# Patient Record
Sex: Male | Born: 1975 | Race: Black or African American | Hispanic: No | Marital: Married | Smoking: Never smoker
Health system: Southern US, Community
[De-identification: ages and names within clinical notes are randomized; demographics above are authoritative.]

## PROBLEM LIST (undated history)

## (undated) DIAGNOSIS — J45909 Unspecified asthma, uncomplicated: Secondary | ICD-10-CM

## (undated) HISTORY — PX: FINGER SURGERY: SHX640

## (undated) HISTORY — PX: SHOULDER ARTHROSCOPY: SHX128

---

## 2016-02-23 ENCOUNTER — Encounter (HOSPITAL_COMMUNITY): Payer: Self-pay

## 2016-02-23 ENCOUNTER — Emergency Department (HOSPITAL_COMMUNITY)
Admission: EM | Admit: 2016-02-23 | Discharge: 2016-02-23 | Disposition: A | Discharge status: Home / Self Care | Payer: BC Managed Care – PPO | Attending: Emergency Medicine | Admitting: Emergency Medicine

## 2016-02-23 ENCOUNTER — Emergency Department (HOSPITAL_COMMUNITY): Payer: BC Managed Care – PPO

## 2016-02-23 DIAGNOSIS — Z7982 Long term (current) use of aspirin: Secondary | ICD-10-CM | POA: Insufficient documentation

## 2016-02-23 DIAGNOSIS — R0789 Other chest pain: Secondary | ICD-10-CM | POA: Diagnosis not present

## 2016-02-23 DIAGNOSIS — J45909 Unspecified asthma, uncomplicated: Secondary | ICD-10-CM | POA: Insufficient documentation

## 2016-02-23 DIAGNOSIS — R079 Chest pain, unspecified: Secondary | ICD-10-CM

## 2016-02-23 HISTORY — DX: Unspecified asthma, uncomplicated: J45.909

## 2016-02-23 LAB — BASIC METABOLIC PANEL
Anion gap: 6 (ref 5–15)
BUN: 12 mg/dL (ref 6–20)
CO2: 31 mmol/L (ref 22–32)
Calcium: 9.5 mg/dL (ref 8.9–10.3)
Chloride: 101 mmol/L (ref 101–111)
Creatinine, Ser: 1.07 mg/dL (ref 0.61–1.24)
GFR calc Af Amer: >60 mL/min (ref >60)
Glucose, Bld: 102 mg/dL — ABNORMAL HIGH (ref 65–99)
POTASSIUM: 3.7 mmol/L (ref 3.5–5.1)
SODIUM: 138 mmol/L (ref 135–145)

## 2016-02-23 LAB — CBC
HEMATOCRIT: 41.5 % (ref 39.0–52.0)
Hemoglobin: 14.6 g/dL (ref 13.0–17.0)
MCH: 31.5 pg (ref 26.0–34.0)
MCHC: 35.2 g/dL (ref 30.0–36.0)
MCV: 89.6 fL (ref 78.0–100.0)
PLATELETS: 225 10*3/uL (ref 150–400)
RBC: 4.63 MIL/uL (ref 4.22–5.81)
RDW: 13 % (ref 11.5–15.5)
WBC: 7.9 10*3/uL (ref 4.0–10.5)

## 2016-02-23 LAB — TROPONIN I

## 2016-02-23 LAB — I-STAT TROPONIN, ED: TROPONIN I, POC: 0 ng/mL (ref 0.00–0.08)

## 2016-02-23 MED ORDER — IBUPROFEN 800 MG PO TABS: 800.0000 mg | ORAL_TABLET | Freq: Three times a day (TID) | ORAL | 0 refills | Status: AC | PRN

## 2016-02-23 NOTE — Discharge Instructions (Signed)
Return here as needed. Follow up with your PCP.  °

## 2016-02-23 NOTE — ED Triage Notes (Signed)
Onset 1 week upon awakening sharp chest pain on left side, sharp pain that lasted few seconds.  Since then has had "discomfort" in chest a few times most days since onset.  No other s/s noted with chest discomfort.  Was sent to ED by PCP for abnormal EKG.

## 2016-02-24 NOTE — ED Provider Notes (Signed)
[facility]-EMERGENCY DEPT Provider Note   CSN: [id number] Arrival date & time: 02/23/16  1550     History   Chief Complaint Chief Complaint  Patient presents with  . Chest Pain    HPI Evan Harris is a 40 y.o. male.  HPI Patient presents to the emergency department with intermittent chest pain over the last week.  The patient states that he awoke last Monday with a sharp pain that lasts for 2 seconds in his chest.  He states it occurred again on Thursday and then has had intermittent discomfort in the bilateral upper part of his chest randomly throughout the last 4 days.  Patient states he was seen by his primary care doctor today and was sent in due to the fact that he had some mild changes on his EKG patient states that he did not take any medications prior to arrival.  States nothing seems to make the condition better or worse. The patient denies shortness of breath, headache,blurred vision, neck pain, fever, cough, weakness, numbness, dizziness, anorexia, edema, abdominal pain, nausea, vomiting, diarrhea, rash, back pain, dysuria, hematemesis, bloody stool, near syncope, or syncope. Past Medical History:  Diagnosis Date  . Asthma     There are no active problems to display for this patient.   Past Surgical History:  Procedure Laterality Date  . FINGER SURGERY    . SHOULDER ARTHROSCOPY         Home Medications    Prior to Admission medications   Medication Sig Start Date End Date Taking? Authorizing Provider  aspirin 325 MG tablet Take 325 mg by mouth every 6 (six) hours as needed for moderate pain.   Yes Historical Provider, MD  guaifenesin (ROBITUSSIN) 100 MG/5ML syrup Take 200 mg by mouth 3 (three) times daily as needed for cough.   Yes Historical Provider, MD  ibuprofen (ADVIL,MOTRIN) 800 MG tablet Take 1 tablet (800 mg total) by mouth every 8 (eight) hours as needed. 02/23/16   Charlestine Nighthristopher Florence Yeung, PA-C    Family History History reviewed. No pertinent family  history.  Social History Social History  Substance Use Topics  . Smoking status: Never Smoker  . Smokeless tobacco: Never Used  . Alcohol use 1.8 oz/week    3 Glasses of wine per week     Allergies   Sulfa antibiotics   Review of Systems Review of Systems All other systems negative except as documented in the HPI. All pertinent positives and negatives as reviewed in the HPI.  Physical Exam Updated Vital Signs BP 101/81   Pulse 61   Temp 98.4 F (36.9 C) (Oral)   Resp 12   Ht 5\' 8"  (1.727 m)   Wt 84.4 kg Comment: was weighed at PCP office today  SpO2 98%   BMI 28.28 kg/m   Physical Exam  Constitutional: He is oriented to person, place, and time. He appears well-developed and well-nourished. No distress.  HENT:  Head: Normocephalic and atraumatic.  Mouth/Throat: Oropharynx is clear and moist.  Eyes: Pupils are equal, round, and reactive to light.  Neck: Normal range of motion. Neck supple.  Cardiovascular: Normal rate, regular rhythm and normal heart sounds.  Exam reveals no gallop and no friction rub.   No murmur heard. Pulmonary/Chest: Effort normal and breath sounds normal. No respiratory distress. He has no wheezes.  Abdominal: Soft. Bowel sounds are normal. He exhibits no distension. There is no tenderness.  Neurological: He is alert and oriented to person, place, and time. He exhibits normal muscle  tone. Coordination normal.  Skin: Skin is warm and dry. No rash noted. No erythema.  Psychiatric: He has a normal mood and affect. His behavior is normal.  Nursing note and vitals reviewed.    ED Treatments / Results  Labs (all labs ordered are listed, but only abnormal results are displayed) Labs Reviewed  BASIC METABOLIC PANEL - Abnormal; Notable for the following:       Result Value   Glucose, Bld 102 (*)    All other components within normal limits  CBC  TROPONIN I  I-STAT TROPOININ, ED    EKG  EKG Interpretation  Date/Time:  Tuesday February 23 2016 16:03:55 EST Ventricular Rate:  65 PR Interval:  156 QRS Duration: 86 QT Interval:  376 QTC Calculation: 391 R Axis:   4 Text Interpretation:  Normal sinus rhythm ST elevation, consider early repolarization Borderline ECG agree. no STEMI Confirmed by Donnald GarrePfeiffer, MD, Lebron ConnersMarcy [phone]([id number]) on 02/23/2016 9:41:23 PM       Radiology Dg Chest 2 View  Result Date: 02/23/2016 CLINICAL DATA:  Chest pain EXAM: CHEST  2 VIEW COMPARISON:  None. FINDINGS: The heart size and mediastinal contours are within normal limits. Both lungs are clear. The visualized skeletal structures are unremarkable. IMPRESSION: No active cardiopulmonary disease. Electronically Signed   By: Marlan Palauharles  Clark M.D.   On: 02/23/2016 16:49    Procedures Procedures (including critical care time)  Medications Ordered in ED Medications - No data to display   Initial Impression / Assessment and Plan / ED Course  I have reviewed the triage vital signs and the nursing notes.  Pertinent labs & imaging results that were available during my care of the patient were reviewed by me and considered in my medical decision making (see chart for details).  Clinical Course     I have reviewed the patient's laboratory testing and EKG the patient is given the results.  The patient is advised that his symptoms are atypical for cardiac chest pain based on the fact that his pain only lasts for seconds at a time.  I advised him this could be several things, but most likely is a chest wall irritation.  I advised the patient to return here as needed.  Also advised follow-up with his primary care doctor for further evaluation and possible further testing.  The patient agrees the plan and all questions were answered  Final Clinical Impressions(s) / ED Diagnoses   Final diagnoses:  Chest pain, unspecified type  Chest wall pain  Atypical chest pain    New Prescriptions Discharge Medication List as of 02/23/2016 10:12 PM    START taking these  medications   Details  ibuprofen (ADVIL,MOTRIN) 800 MG tablet Take 1 tablet (800 mg total) by mouth every 8 (eight) hours as needed., Starting Tue 02/23/2016, Print         Eli Lilly and CompanyChristopher Mary-Anne Polizzi, PA-C 02/24/16 [id number]    Arby BarretteMarcy Pfeiffer, MD 03/04/16 2303

## 2017-06-02 IMAGING — DX DG CHEST 2V
2 series · 2 of 2 positions shown · non-contrast
Comparison: None.

CLINICAL DATA: Chest pain

EXAM:
CHEST  2 VIEW

[chest pa]
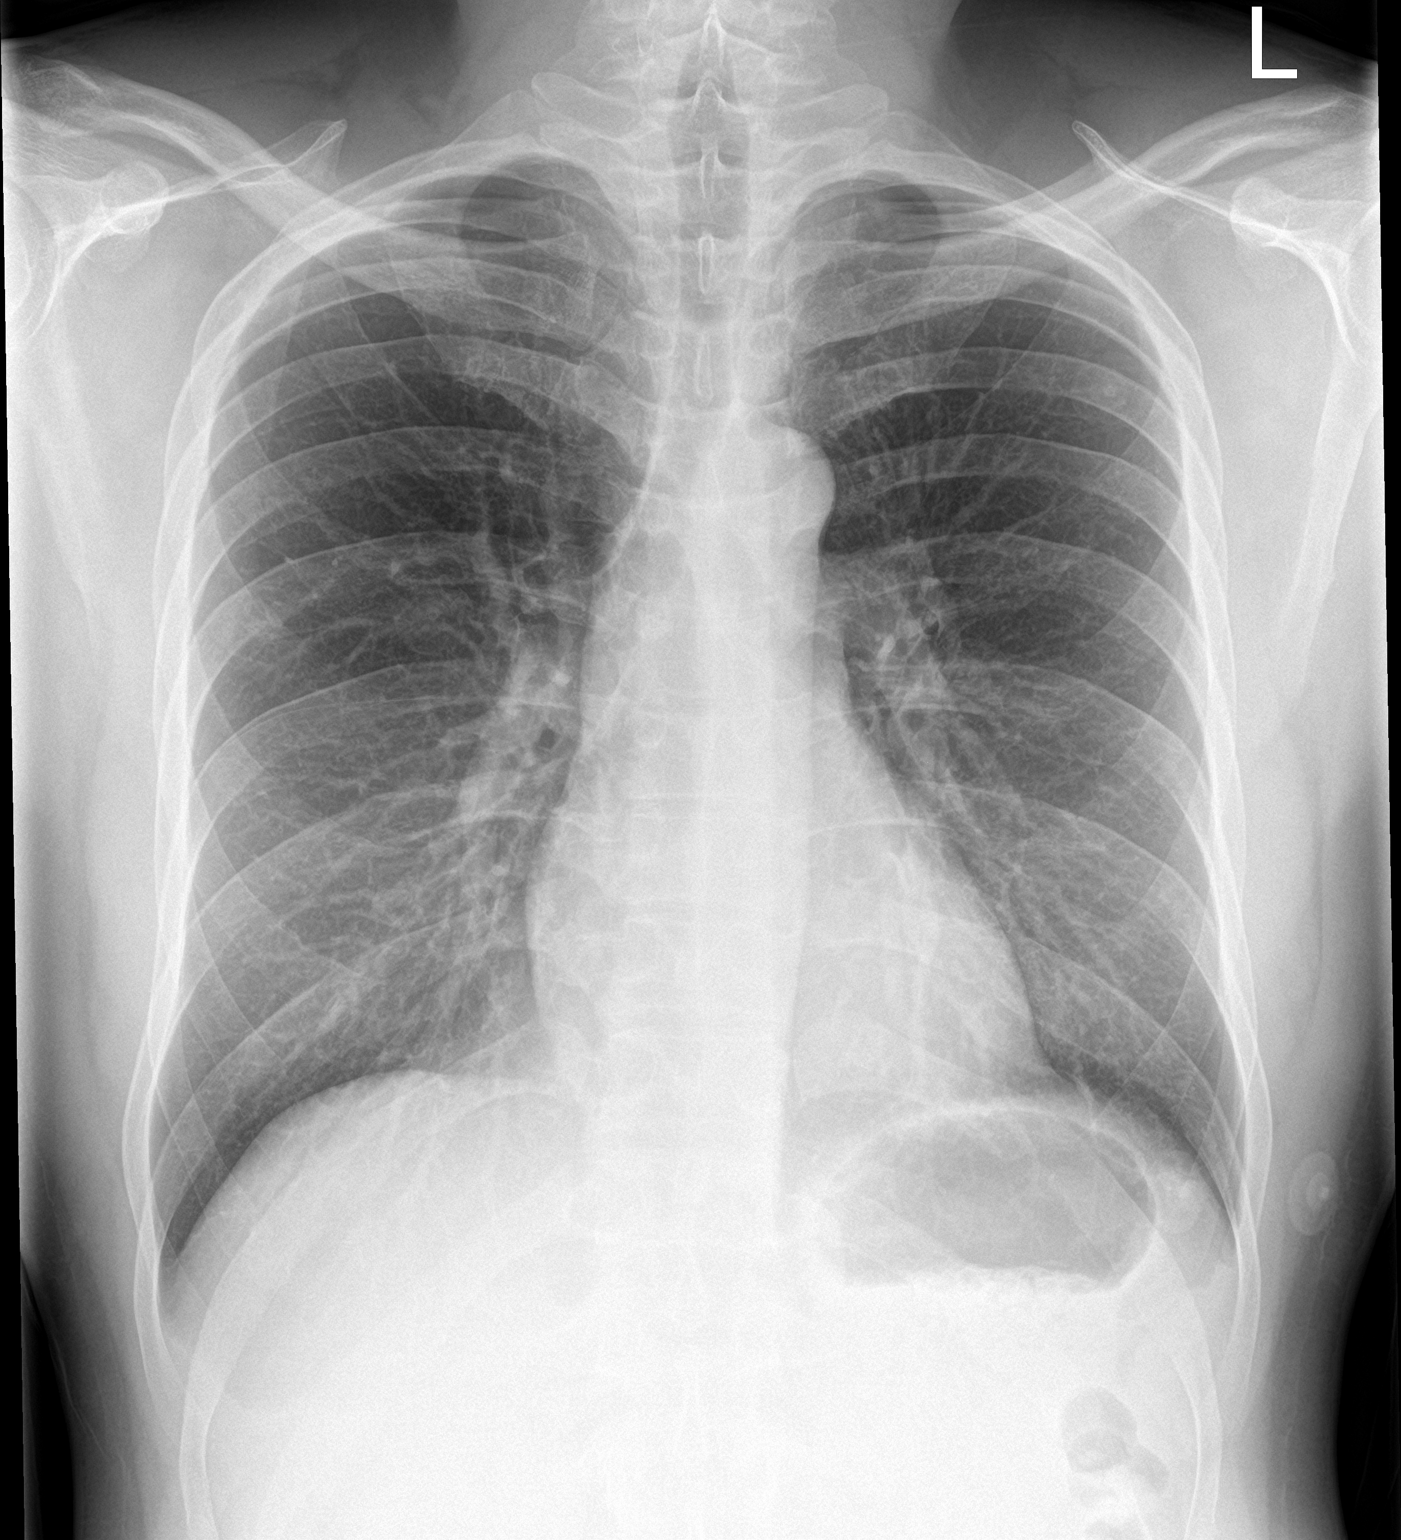

[chest lat]
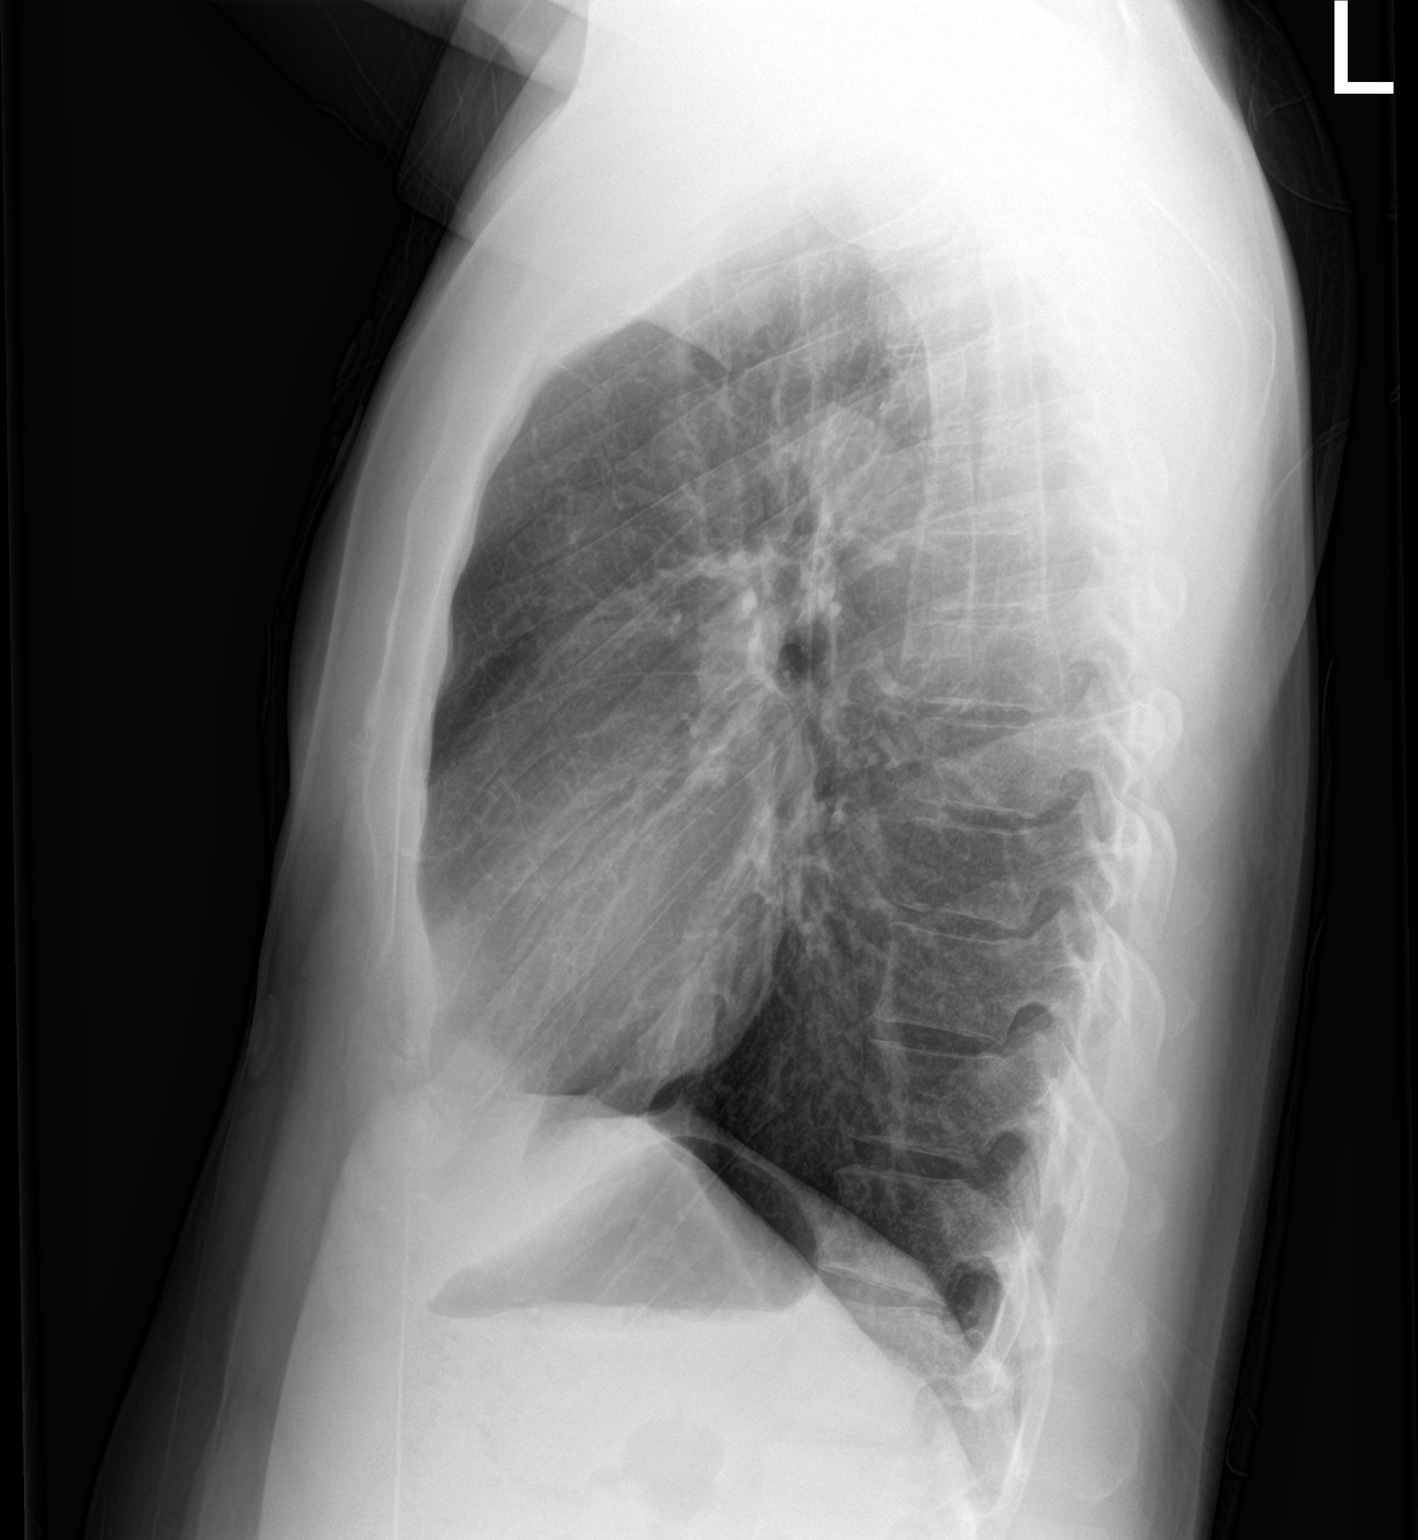

[2 of 2 positions shown; findings below may reference images not displayed]

FINDINGS: The heart size and mediastinal contours are within normal limits.
Both lungs are clear. The visualized skeletal structures are
unremarkable.
IMPRESSION: No active cardiopulmonary disease.

## 2019-06-20 ENCOUNTER — Ambulatory Visit: Payer: BC Managed Care – PPO | Attending: Family

## 2019-06-20 DIAGNOSIS — Z23 Encounter for immunization: Secondary | ICD-10-CM

## 2019-06-20 NOTE — Progress Notes (Signed)
   Covid-19 Vaccination Clinic  Name:  Evan Harris    MRN: [id number] DOB: 04/08/1976  06/20/2019  Mr. See was observed post Covid-19 immunization for 15 minutes without incident. He was provided with Vaccine Information Sheet and instruction to access the V-Safe system.   Mr. Boy was instructed to call 911 with any severe reactions post vaccine: Marland Kitchen Difficulty breathing  . Swelling of face and throat  . A fast heartbeat  . A bad rash all over body  . Dizziness and weakness   Immunizations Administered    Name Date Dose VIS Date Route   Moderna COVID-19 Vaccine 06/20/2019  3:39 PM 0.5 mL 03/12/2019 Intramuscular   Manufacturer: Moderna   Lot: [id number]   NDC: 97282-060-15

## 2019-07-18 ENCOUNTER — Ambulatory Visit: Payer: BC Managed Care – PPO | Attending: Family

## 2019-07-18 DIAGNOSIS — Z23 Encounter for immunization: Secondary | ICD-10-CM

## 2019-07-18 NOTE — Progress Notes (Signed)
   Covid-19 Vaccination Clinic  Name:  ZENITH LAMPHIER    MRN: [id number] DOB: [birth year]  07/18/2019  Mr. Vanbergen was observed post Covid-19 immunization for 15 minutes without incident. He was provided with Vaccine Information Sheet and instruction to access the V-Safe system.   Mr. Kabat was instructed to call 911 with any severe reactions post vaccine: Marland Kitchen Difficulty breathing  . Swelling of face and throat  . A fast heartbeat  . A bad rash all over body  . Dizziness and weakness   Immunizations Administered    Name Date Dose VIS Date Route   Moderna COVID-19 Vaccine 07/18/2019 10:23 AM 0.5 mL 03/12/2019 Intramuscular   Manufacturer: Moderna   Lot: [id number]   NDC: 73419-379-02

## 2019-07-23 ENCOUNTER — Ambulatory Visit: Payer: BC Managed Care – PPO

## 2020-04-17 ENCOUNTER — Ambulatory Visit: Payer: BC Managed Care – PPO

## 2020-04-21 ENCOUNTER — Ambulatory Visit: Payer: Self-pay | Attending: Internal Medicine

## 2020-04-21 DIAGNOSIS — Z23 Encounter for immunization: Secondary | ICD-10-CM

## 2020-04-21 NOTE — Progress Notes (Signed)
   Covid-19 Vaccination Clinic  Name:  Evan Harris    MRN: [id number] DOB: July 29, 1975  04/21/2020  [name] was observed post Covid-19 immunization for 15 minutes without incident. He was provided with Vaccine Information Sheet and instruction to access the V-Safe system.   [name] was instructed to call 911 with any severe reactions post vaccine: Marland Kitchen Difficulty breathing  . Swelling of face and throat  . A fast heartbeat  . A bad rash all over body  . Dizziness and weakness   Immunizations Administered    Name Date Dose VIS Date Route   Moderna Covid-19 Booster Vaccine 04/21/2020  2:13 PM 0.25 mL 01/29/2020 Intramuscular   Manufacturer: Gala Murdoch   Lot: [id number]   NDC: 21798-102-54

## 2022-06-17 ENCOUNTER — Emergency Department (HOSPITAL_COMMUNITY)
Admission: EM | Admit: 2022-06-17 | Discharge: 2022-06-17 | Disposition: A | Discharge status: Home / Self Care | Payer: BC Managed Care – PPO | Attending: Emergency Medicine | Admitting: Emergency Medicine

## 2022-06-17 ENCOUNTER — Emergency Department (HOSPITAL_COMMUNITY): Payer: BC Managed Care – PPO

## 2022-06-17 ENCOUNTER — Other Ambulatory Visit: Payer: Self-pay

## 2022-06-17 ENCOUNTER — Encounter (HOSPITAL_COMMUNITY): Payer: Self-pay

## 2022-06-17 DIAGNOSIS — J45909 Unspecified asthma, uncomplicated: Secondary | ICD-10-CM | POA: Diagnosis not present

## 2022-06-17 DIAGNOSIS — R9431 Abnormal electrocardiogram [ECG] [EKG]: Secondary | ICD-10-CM | POA: Insufficient documentation

## 2022-06-17 DIAGNOSIS — R202 Paresthesia of skin: Secondary | ICD-10-CM | POA: Insufficient documentation

## 2022-06-17 DIAGNOSIS — Z7982 Long term (current) use of aspirin: Secondary | ICD-10-CM | POA: Insufficient documentation

## 2022-06-17 LAB — CBC WITH DIFFERENTIAL/PLATELET
Abs Immature Granulocytes: 0.02 10*3/uL (ref 0.00–0.07)
Basophils Absolute: 0 10*3/uL (ref 0.0–0.1)
Basophils Relative: 1 %
Eosinophils Absolute: 0.2 10*3/uL (ref 0.0–0.5)
Eosinophils Relative: 3 %
HCT: 40.9 % (ref 39.0–52.0)
Hemoglobin: 14.5 g/dL (ref 13.0–17.0)
Immature Granulocytes: 0 %
Lymphocytes Relative: 38 %
Lymphs Abs: 3.1 10*3/uL (ref 0.7–4.0)
MCH: 32.5 pg (ref 26.0–34.0)
MCHC: 35.5 g/dL (ref 30.0–36.0)
MCV: 91.7 fL (ref 80.0–100.0)
Monocytes Absolute: 0.6 10*3/uL (ref 0.1–1.0)
Monocytes Relative: 8 %
Neutro Abs: 4.2 10*3/uL (ref 1.7–7.7)
Neutrophils Relative %: 50 %
Platelets: 209 10*3/uL (ref 150–400)
RBC: 4.46 MIL/uL (ref 4.22–5.81)
RDW: 13.2 % (ref 11.5–15.5)
WBC: 8.2 10*3/uL (ref 4.0–10.5)
nRBC: 0 % (ref 0.0–0.2)

## 2022-06-17 LAB — BASIC METABOLIC PANEL
Anion gap: 9 (ref 5–15)
BUN: 12 mg/dL (ref 6–20)
CO2: 29 mmol/L (ref 22–32)
Calcium: 9.1 mg/dL (ref 8.9–10.3)
Chloride: 101 mmol/L (ref 98–111)
Creatinine, Ser: 1.29 mg/dL — ABNORMAL HIGH (ref 0.61–1.24)
GFR, Estimated: >60 mL/min (ref >60)
Glucose, Bld: 88 mg/dL (ref 70–99)
Potassium: 3.8 mmol/L (ref 3.5–5.1)
Sodium: 139 mmol/L (ref 135–145)

## 2022-06-17 LAB — TROPONIN I (HIGH SENSITIVITY)
Troponin I (High Sensitivity): 3 ng/L (ref <18)
Troponin I (High Sensitivity): 3 ng/L (ref <18)

## 2022-06-17 NOTE — ED Provider Notes (Signed)
Evan Harris Provider Note   CSN: [id number] Arrival date & time: 06/17/22  1633     History  Chief Complaint  Patient presents with   Abnormal ECG    Evan Harris is a 47 y.o. male.  This is a 47 year old male with history of asthma presenting to the ED for an abnormal ECG.  Patient states he was seen by his PCP today for some tingling in his left arm as well as wellness check.  They saw his EKG and were concerned and sent him to the ED for evaluation.  He denies any chest pain, shortness of breath, diaphoresis, nausea, vomiting, abdominal pain.  No family history of early coronary artery disease, no history of hypertension, does not smoke.     Home Medications Prior to Admission medications   Medication Sig Start Date End Date Taking? Authorizing Provider  aspirin 325 MG tablet Take 325 mg by mouth every 6 (six) hours as needed for moderate pain.    [provider]  guaifenesin (ROBITUSSIN) 100 MG/5ML syrup Take 200 mg by mouth 3 (three) times daily as needed for cough.    [provider]  ibuprofen (ADVIL,MOTRIN) 800 MG tablet Take 1 tablet (800 mg total) by mouth every 8 (eight) hours as needed. 02/23/16   Lawyer, Harrell Gave, PA-C      Allergies    Sulfa antibiotics    Review of Systems   Review of Systems  Constitutional:  Negative for chills and fever.  Respiratory:  Negative for cough and shortness of breath.   Cardiovascular:  Negative for chest pain.  Gastrointestinal:  Negative for abdominal pain.  Neurological:  Negative for seizures and weakness.    Physical Exam Updated Vital Signs BP (!) 144/92 (BP Location: Right Arm)   Pulse 78   Temp 98.1 F (36.7 C) (Oral)   Resp 18   Ht '5\' 9"'$  (1.753 m)   Wt 85.3 kg   SpO2 100%   BMI 27.76 kg/m  Physical Exam Vitals and nursing note reviewed.  Constitutional:      General: He is not in acute distress.    Appearance: He is well-developed.   HENT:     Head: Normocephalic and atraumatic.  Eyes:     Conjunctiva/sclera: Conjunctivae normal.  Cardiovascular:     Rate and Rhythm: Normal rate and regular rhythm.     Pulses: Normal pulses.     Heart sounds: No murmur heard.    No friction rub. No gallop.  Pulmonary:     Effort: Pulmonary effort is normal. No respiratory distress.     Breath sounds: Normal breath sounds.  Abdominal:     Palpations: Abdomen is soft.     Tenderness: There is no abdominal tenderness.  Musculoskeletal:        General: No swelling.     Cervical back: Neck supple.  Skin:    General: Skin is warm and dry.     Capillary Refill: Capillary refill takes less than 2 seconds.  Neurological:     General: No focal deficit present.     Mental Status: He is alert and oriented to person, place, and time.  Psychiatric:        Mood and Affect: Mood normal.     ED Results / Procedures / Treatments   Labs (all labs ordered are listed, but only abnormal results are displayed) Labs Reviewed  BASIC METABOLIC PANEL - Abnormal; Notable for the following components:  Result Value   Creatinine, Ser 1.29 (*)    All other components within normal limits  CBC WITH DIFFERENTIAL/PLATELET  TROPONIN I (HIGH SENSITIVITY)  TROPONIN I (HIGH SENSITIVITY)    EKG None  Radiology DG Chest 2 View  Result Date: 06/17/2022 CLINICAL DATA:  Abnormal EKG. EXAM: CHEST - 2 VIEW COMPARISON:  Chest radiographs 02/23/2016 FINDINGS: Cardiac silhouette and mediastinal contours are within normal limits. The lungs are clear. No pleural effusion or pneumothorax. No acute skeletal abnormality. IMPRESSION: No active cardiopulmonary disease. Electronically Signed   By: Yvonne Kendall M.D.   On: 06/17/2022 17:10    Procedures Procedures    Medications Ordered in ED Medications - No data to display  ED Course/ Medical Decision Making/ A&P                             Medical Decision Making Patient presents with an abnormal  ECG per PCP.  He has some intermittent left arm tingling but denies any chest pain or shortness of breath.  He has no exertional pain, no diaphoresis, nausea or vomiting.  Will obtain an ACS workup including serial troponins and EKG.  Chest x-ray ordered, screening labs including a CBC and a BMP ordered to evaluate for electrolyte abnormalities.  I personally reviewed and interpreted patient's ECG which shows sinus rhythm with rate 64, PR, QRS, QTc normal, no ST elevations or ST depressions, no T wave inversions to suggest acute ischemia.  This is an otherwise normal ECG.  I personally reviewed and interpreted patient's chest x-ray, agree with radiology, no focal consolidations, cardiac silhouette within normal limits, no pleural effusions or pneumothorax.    I personally reviewed and interpreted patient's labs, no electrolyte abnormalities on BMP, CBC normal, initial troponin 3, repeat troponin 3.  Low suspicion for ACS given normal ECG.    Patient continues to be chest pain-free, he is overall very well-appearing in no acute distress.  Vitals are stable, he is mildly hypertensive to 144/92 however no evidence of fever, no tachypnea and satting at 100% on room air.  I discussed with patient we have very low concern for ACS at this time, his EKG is normal and we feel he is safe for outpatient follow-up.  We recommended follow-up with his PCP in 1 week for reevaluation.  Return precautions given if he experiences any chest pain or shortness of breath return to the ED for evaluation.   Patient was stable at discharge.  Problems Addressed: ECG abnormal: acute illness or injury that poses a threat to life or bodily functions  Amount and/or Complexity of Data Reviewed External Data Reviewed: notes.    Details: Chart review patient seen by PCP earlier today, concern for possible ECG abnormalities however I am unable to access the EKG at that time. Labs: ordered. Decision-making details documented in ED  Course. Radiology: ordered and independent interpretation performed. Decision-making details documented in ED Course. ECG/medicine tests: ordered and independent interpretation performed. Decision-making details documented in ED Course.          Final Clinical Impression(s) / ED Diagnoses Final diagnoses:  None    Rx / DC Orders ED Discharge Orders     None         Jimmie Molly, MD 06/17/22 2049    Isla Pence, MD 06/17/22 2150

## 2022-06-17 NOTE — ED Provider Triage Note (Signed)
Emergency Medicine Provider Triage Evaluation Note  Evan Harris , a 47 y.o. male  was evaluated in triage.  Pt complains of abnormal EKG at PCP office today.  Went for routine physical. Had EKG done, told slightly abnormal and sent to ER.  Sent by [facility] physicians  Review of Systems  Positive:  Negative:   Physical Exam  There were no vitals taken for this visit. Gen:   Awake, no distress   Resp:  Normal effort  MSK:   Moves extremities without difficulty  Other:  HR RRR, lungs CTA  Medical Decision Making  Medically screening exam initiated at 4:39 PM.  Appropriate orders placed.  Aron Baba was informed that the remainder of the evaluation will be completed by another provider, this initial triage assessment does not replace that evaluation, and the importance of remaining in the ED until their evaluation is complete.  EKG NSR   Tacy Learn, PA-C 06/17/22 1643

## 2022-06-17 NOTE — Discharge Instructions (Signed)
You were evaluated in the emergency department, your troponins which are indicators of heart muscle damage were normal.  Your EKG looks normal without any evidence of an acute heart attack.  Your electrolytes and labs as well as chest x-ray all looked great.  We have very low suspicion that you are having a heart attack or having heart damage at this time.  Please follow-up with your PCP in 1 week for reevaluation.  Return to the ED if you are having any chest pain or shortness of breath.

## 2022-06-17 NOTE — ED Triage Notes (Signed)
Pt reports he went in for an annual physical today and had an EKG done. He was told to come to the ER because his EKG was "slightly abnormal" due to ST elevation. Pt denies chest pain or shortness of breath.
